# Patient Record
Sex: Male | Born: 1970 | Race: White | Hispanic: No | Marital: Single | Smoking: Current every day smoker
Health system: Southern US, Community
[De-identification: ages and names within clinical notes are randomized; demographics above are authoritative.]

## PROBLEM LIST (undated history)

## (undated) HISTORY — PX: APPENDECTOMY: SHX54

---

## 2013-03-22 ENCOUNTER — Emergency Department (HOSPITAL_COMMUNITY)
Admission: EM | Admit: 2013-03-22 | Discharge: 2013-03-22 | Disposition: A | Discharge status: Home / Self Care | Payer: No Typology Code available for payment source | Attending: Emergency Medicine | Admitting: Emergency Medicine

## 2013-03-22 ENCOUNTER — Emergency Department (HOSPITAL_COMMUNITY): Payer: No Typology Code available for payment source

## 2013-03-22 ENCOUNTER — Encounter (HOSPITAL_COMMUNITY): Payer: Self-pay | Admitting: Nurse Practitioner

## 2013-03-22 DIAGNOSIS — S46909A Unspecified injury of unspecified muscle, fascia and tendon at shoulder and upper arm level, unspecified arm, initial encounter: Secondary | ICD-10-CM | POA: Insufficient documentation

## 2013-03-22 DIAGNOSIS — Y9389 Activity, other specified: Secondary | ICD-10-CM | POA: Insufficient documentation

## 2013-03-22 DIAGNOSIS — S92919A Unspecified fracture of unspecified toe(s), initial encounter for closed fracture: Secondary | ICD-10-CM | POA: Insufficient documentation

## 2013-03-22 DIAGNOSIS — Z23 Encounter for immunization: Secondary | ICD-10-CM | POA: Insufficient documentation

## 2013-03-22 DIAGNOSIS — T148XXA Other injury of unspecified body region, initial encounter: Secondary | ICD-10-CM

## 2013-03-22 DIAGNOSIS — S92401A Displaced unspecified fracture of right great toe, initial encounter for closed fracture: Secondary | ICD-10-CM

## 2013-03-22 DIAGNOSIS — Y92410 Unspecified street and highway as the place of occurrence of the external cause: Secondary | ICD-10-CM | POA: Insufficient documentation

## 2013-03-22 DIAGNOSIS — S4980XA Other specified injuries of shoulder and upper arm, unspecified arm, initial encounter: Secondary | ICD-10-CM | POA: Insufficient documentation

## 2013-03-22 MED ORDER — CEPHALEXIN 500 MG PO CAPS: 500.0000 mg | ORAL_CAPSULE | Freq: Four times a day (QID) | ORAL | Status: DC

## 2013-03-22 MED ORDER — OXYCODONE-ACETAMINOPHEN 5-325 MG PO TABS
2.0000 | ORAL_TABLET | Freq: Once | ORAL | Status: AC
  Administered 2013-03-22: 2 via ORAL
  Filled 2013-03-22: qty 2

## 2013-03-22 MED ORDER — TETANUS-DIPHTH-ACELL PERTUSSIS 5-2.5-18.5 LF-MCG/0.5 IM SUSP
0.5000 mL | Freq: Once | INTRAMUSCULAR | Status: AC
  Administered 2013-03-22: 0.5 mL via INTRAMUSCULAR
  Filled 2013-03-22: qty 0.5

## 2013-03-22 MED ORDER — OXYCODONE-ACETAMINOPHEN 5-325 MG PO TABS: 1.0000 | ORAL_TABLET | Freq: Three times a day (TID) | ORAL | Status: DC | PRN

## 2013-03-22 NOTE — Progress Notes (Signed)
Orthopedic Tech Progress Note Patient Details:  Timothy Pierce September 23, 1970 [id number]  Ortho Devices Type of Ortho Device: CAM walker Ortho Device/Splint Interventions: Ordered;Application   Jennye Moccasin 03/22/2013, 9:03 PM

## 2013-03-22 NOTE — ED Notes (Signed)
Pt has abrasion to right arm and left elbow. Wrapped with gauze by ems. C/o right foot pain.
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
## 2013-03-25 NOTE — ED Provider Notes (Signed)
Medical screening examination/treatment/procedure(s) were performed by non-physician practitioner and as supervising physician I was immediately available for consultation/collaboration.   David H Yao, MD 03/25/13 2122 

## 2013-04-10 ENCOUNTER — Ambulatory Visit: Payer: Self-pay | Admitting: Internal Medicine

## 2013-04-10 ENCOUNTER — Encounter: Payer: Self-pay | Admitting: Internal Medicine

## 2013-04-10 ENCOUNTER — Ambulatory Visit: Payer: Self-pay | Attending: Internal Medicine | Admitting: Internal Medicine

## 2013-04-10 DIAGNOSIS — IMO0002 Reserved for concepts with insufficient information to code with codable children: Secondary | ICD-10-CM | POA: Insufficient documentation

## 2013-04-10 DIAGNOSIS — Z09 Encounter for follow-up examination after completed treatment for conditions other than malignant neoplasm: Secondary | ICD-10-CM | POA: Insufficient documentation
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
## 2016-06-14 ENCOUNTER — Emergency Department (HOSPITAL_COMMUNITY): Payer: Self-pay

## 2016-06-14 ENCOUNTER — Encounter (HOSPITAL_COMMUNITY): Payer: Self-pay

## 2016-06-14 ENCOUNTER — Emergency Department (HOSPITAL_COMMUNITY)
Admission: EM | Admit: 2016-06-14 | Discharge: 2016-06-14 | Disposition: A | Discharge status: Home / Self Care | Payer: Self-pay | Attending: Emergency Medicine | Admitting: Emergency Medicine

## 2016-06-14 DIAGNOSIS — F17210 Nicotine dependence, cigarettes, uncomplicated: Secondary | ICD-10-CM | POA: Insufficient documentation

## 2016-06-14 DIAGNOSIS — M109 Gout, unspecified: Secondary | ICD-10-CM | POA: Insufficient documentation

## 2016-06-14 MED ORDER — DEXAMETHASONE SODIUM PHOSPHATE 10 MG/ML IJ SOLN
10.0000 mg | Freq: Once | INTRAMUSCULAR | Status: AC
  Administered 2016-06-14: 10 mg via INTRAMUSCULAR
  Filled 2016-06-14: qty 1

## 2016-06-14 MED ORDER — OXYCODONE-ACETAMINOPHEN 5-325 MG PO TABS
1.0000 | ORAL_TABLET | Freq: Once | ORAL | Status: AC
  Administered 2016-06-14: 1 via ORAL
  Filled 2016-06-14: qty 1

## 2016-06-14 MED ORDER — INDOMETHACIN 50 MG PO CAPS: 50.0000 mg | ORAL_CAPSULE | Freq: Two times a day (BID) | ORAL | 0 refills | Status: DC

## 2016-06-14 MED ORDER — OXYCODONE-ACETAMINOPHEN 5-325 MG PO TABS: 1.0000 | ORAL_TABLET | ORAL | 0 refills | Status: DC | PRN

## 2016-06-14 NOTE — ED Notes (Signed)
Papers and medications reviewed with patient and he verbalizes understanding. 

## 2016-06-14 NOTE — ED Triage Notes (Signed)
Pt. sts that he woke up with pain in his foot yesterday. He works on his feet all day and sts he has never has pain this bad in his feet

## 2016-06-14 NOTE — Discharge Instructions (Signed)
Take your medications as prescribed as needed for pain relief. I recommend resting, elevating and applying ice to her foot for 15-20 minutes 3-4 times daily to additionally help with swelling and pain. Please follow up with a primary care provider from the Resource Guide provided below in one week if your symptoms have not improved. Please return to the Emergency Department if symptoms worsen or new onset of fever, worsening redness/swelling, numbness, tingling, weakness, decreased range of motion.
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
## 2016-08-31 ENCOUNTER — Encounter: Payer: Self-pay | Admitting: Sports Medicine

## 2016-08-31 ENCOUNTER — Ambulatory Visit (INDEPENDENT_AMBULATORY_CARE_PROVIDER_SITE_OTHER): Payer: Commercial Managed Care - PPO | Admitting: Sports Medicine

## 2016-08-31 ENCOUNTER — Ambulatory Visit (INDEPENDENT_AMBULATORY_CARE_PROVIDER_SITE_OTHER): Payer: Commercial Managed Care - PPO

## 2016-08-31 DIAGNOSIS — S92501A Displaced unspecified fracture of right lesser toe(s), initial encounter for closed fracture: Secondary | ICD-10-CM | POA: Diagnosis not present

## 2016-08-31 DIAGNOSIS — M1000 Idiopathic gout, unspecified site: Secondary | ICD-10-CM | POA: Diagnosis not present

## 2016-08-31 DIAGNOSIS — M79671 Pain in right foot: Secondary | ICD-10-CM | POA: Diagnosis not present

## 2016-08-31 DIAGNOSIS — M109 Gout, unspecified: Secondary | ICD-10-CM

## 2016-08-31 MED ORDER — METHYLPREDNISOLONE 4 MG PO TBPK: ORAL_TABLET | ORAL | 0 refills | Status: DC

## 2016-08-31 MED ORDER — COLCHICINE 0.6 MG PO TABS: 0.6000 mg | ORAL_TABLET | Freq: Every day | ORAL | 0 refills | Status: AC
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
## 2016-08-31 NOTE — Patient Instructions (Signed)

## 2016-09-21 ENCOUNTER — Ambulatory Visit: Payer: Commercial Managed Care - PPO | Admitting: Sports Medicine

## 2016-09-22 ENCOUNTER — Encounter: Payer: Self-pay | Admitting: Podiatry

## 2016-09-22 ENCOUNTER — Ambulatory Visit (INDEPENDENT_AMBULATORY_CARE_PROVIDER_SITE_OTHER): Payer: Commercial Managed Care - PPO

## 2016-09-22 ENCOUNTER — Ambulatory Visit (INDEPENDENT_AMBULATORY_CARE_PROVIDER_SITE_OTHER): Payer: Commercial Managed Care - PPO | Admitting: Podiatry

## 2016-09-22 DIAGNOSIS — M7751 Other enthesopathy of right foot: Secondary | ICD-10-CM

## 2016-09-22 DIAGNOSIS — S92501D Displaced unspecified fracture of right lesser toe(s), subsequent encounter for fracture with routine healing: Secondary | ICD-10-CM

## 2016-09-22 DIAGNOSIS — M109 Gout, unspecified: Secondary | ICD-10-CM | POA: Diagnosis not present

## 2016-09-22 MED ORDER — BETAMETHASONE SOD PHOS & ACET 6 (3-3) MG/ML IJ SUSP: 3.0000 mg | Freq: Once | INTRAMUSCULAR | Status: AC

## 2016-09-22 NOTE — Progress Notes (Signed)
   Subjective:  46 year old male presents to the office today for follow-up evaluation of a closed fracture to the fifth digit right foot as well as acute gout to the right foot. Patient states that he feels much better radiographic work. Patient was last seen on 08/31/2016 in the care of Dr. Marylene LandStover. On last visit patient was prescribed a Medrol Dosepak as well as colchicine 0.6 mg. Also postoperative shoe was dispensed.    Objective/Physical Exam General: The patient is alert and oriented x3 in no acute distress.  Dermatology: Skin is warm, dry and supple bilateral lower extremities. Negative for open lesions or macerations.  Vascular: Palpable pedal pulses bilaterally. No edema or erythema noted. Capillary refill within normal limits.  Neurological: Epicritic and protective threshold grossly intact bilaterally.   Musculoskeletal Exam: Moderate pain on palpation and range of motion to the first MPJ right foot. Minimal amount of edema localized around the joint.  Radiographic Exam:  Closed, nondisplaced fracture of the head of the proximal phalanx fifth digit right foot with routine healing.  Assessment: #1 fracture fifth digit right foot #2 acute gout first MPJ right foot with capsulitis   Plan of Care:  #1 Patient was evaluated. #2 injection of 0.5 mL Celestone Soluspan injected in the first MPJ right foot #3 medical note to return to work was provided #4 return to clinic as needed   Felecia ShellingBrent M. Evans, DPM [facility]  Dr. Felecia ShellingBrent M. Evans, DPM    [address]                                        [place], [place] [address]                Office [phone]  Fax [phone]

## 2016-10-05 ENCOUNTER — Ambulatory Visit: Payer: Commercial Managed Care - PPO | Admitting: Sports Medicine

## 2016-10-18 ENCOUNTER — Other Ambulatory Visit: Payer: Self-pay | Admitting: Podiatry

## 2016-10-18 ENCOUNTER — Ambulatory Visit (INDEPENDENT_AMBULATORY_CARE_PROVIDER_SITE_OTHER): Payer: Commercial Managed Care - PPO | Admitting: Podiatry

## 2016-10-18 ENCOUNTER — Encounter: Payer: Self-pay | Admitting: Podiatry

## 2016-10-18 DIAGNOSIS — M7751 Other enthesopathy of right foot: Secondary | ICD-10-CM | POA: Diagnosis not present

## 2016-10-18 DIAGNOSIS — M109 Gout, unspecified: Secondary | ICD-10-CM | POA: Diagnosis not present

## 2016-10-18 MED ORDER — ALLOPURINOL 100 MG PO TABS: 100.0000 mg | ORAL_TABLET | Freq: Every day | ORAL | 6 refills | Status: AC

## 2016-10-19 LAB — URIC ACID: URIC ACID: 7.1 mg/dL (ref 3.7–8.6)

## 2016-10-22 MED ORDER — BETAMETHASONE SOD PHOS & ACET 6 (3-3) MG/ML IJ SUSP: 3.0000 mg | Freq: Once | INTRAMUSCULAR | Status: AC

## 2016-10-22 NOTE — Progress Notes (Signed)
   Subjective:  Patient presents today for follow-up evaluation of chronic recurrence of acute gout attacks. Patient was last seen 06/22/2017 for evaluation of an acute gout attack to the first MPJ right foot. Patient states that he continues to have pain and tenderness to the first MPJ right foot.    Objective/Physical Exam General: The patient is alert and oriented x3 in no acute distress.  Dermatology: Skin is warm, dry and supple bilateral lower extremities. Negative for open lesions or macerations.  Vascular: Palpable pedal pulses bilaterally. No edema or erythema noted. Capillary refill within normal limits.  Neurological: Epicritic and protective threshold grossly intact bilaterally.   Musculoskeletal Exam: Moderate pain on palpation and range of motion to the first MPJ right foot. Minimal amount of edema localized around the joint.  Assessment: #1 fracture fifth digit right foot-resolved #2 acute gout first MPJ right foot with capsulitis   Plan of Care:  #1 Patient was evaluated. #2 injection of 0.5 mL Celestone Soluspan injected in the first MPJ right foot #3 orders for uric acid levels were placed today #4 prescription for allopurinol #5 return to clinic in 4 weeks  Felecia Shelling, DPM [facility]  Dr. Felecia Shelling, DPM    [address]                                        [place], [place] [address]                Office [phone]  Fax [phone]

## 2016-11-15 ENCOUNTER — Ambulatory Visit: Payer: Commercial Managed Care - PPO | Admitting: Podiatry

## 2017-02-21 ENCOUNTER — Emergency Department (HOSPITAL_COMMUNITY)
Admission: EM | Admit: 2017-02-21 | Discharge: 2017-02-21 | Disposition: A | Discharge status: Home / Self Care | Payer: Commercial Managed Care - PPO | Attending: Emergency Medicine | Admitting: Emergency Medicine

## 2017-02-21 ENCOUNTER — Emergency Department (HOSPITAL_COMMUNITY): Payer: Commercial Managed Care - PPO

## 2017-02-21 ENCOUNTER — Encounter (HOSPITAL_COMMUNITY): Payer: Self-pay | Admitting: Emergency Medicine

## 2017-02-21 DIAGNOSIS — F17210 Nicotine dependence, cigarettes, uncomplicated: Secondary | ICD-10-CM | POA: Insufficient documentation

## 2017-02-21 DIAGNOSIS — K047 Periapical abscess without sinus: Secondary | ICD-10-CM | POA: Insufficient documentation

## 2017-02-21 DIAGNOSIS — Z79899 Other long term (current) drug therapy: Secondary | ICD-10-CM | POA: Insufficient documentation

## 2017-02-21 DIAGNOSIS — F1020 Alcohol dependence, uncomplicated: Secondary | ICD-10-CM | POA: Insufficient documentation

## 2017-02-21 DIAGNOSIS — R220 Localized swelling, mass and lump, head: Secondary | ICD-10-CM | POA: Diagnosis present

## 2017-02-21 LAB — CBC WITH DIFFERENTIAL/PLATELET
BASOS ABS: 0.1 10*3/uL (ref 0.0–0.1)
BASOS PCT: 0 %
EOS ABS: 0.1 10*3/uL (ref 0.0–0.7)
Eosinophils Relative: 0 %
HEMATOCRIT: 40.9 % (ref 39.0–52.0)
HEMOGLOBIN: 14.1 g/dL (ref 13.0–17.0)
Lymphocytes Relative: 14 %
Lymphs Abs: 2.1 10*3/uL (ref 0.7–4.0)
MCH: 30 pg (ref 26.0–34.0)
MCHC: 34.5 g/dL (ref 30.0–36.0)
MCV: 87 fL (ref 78.0–100.0)
Monocytes Absolute: 1.5 10*3/uL — ABNORMAL HIGH (ref 0.1–1.0)
Monocytes Relative: 9 %
NEUTROS ABS: 12.1 10*3/uL — AB (ref 1.7–7.7)
NEUTROS PCT: 77 %
Platelets: 251 10*3/uL (ref 150–400)
RBC: 4.7 MIL/uL (ref 4.22–5.81)
RDW: 12.9 % (ref 11.5–15.5)
WBC: 15.8 10*3/uL — AB (ref 4.0–10.5)

## 2017-02-21 LAB — COMPREHENSIVE METABOLIC PANEL
ALK PHOS: 52 U/L (ref 38–126)
ALT: 23 U/L (ref 17–63)
ANION GAP: 10 (ref 5–15)
AST: 26 U/L (ref 15–41)
Albumin: 3.5 g/dL (ref 3.5–5.0)
BILIRUBIN TOTAL: 0.8 mg/dL (ref 0.3–1.2)
BUN: 9 mg/dL (ref 6–20)
CALCIUM: 8.8 mg/dL — AB (ref 8.9–10.3)
CO2: 20 mmol/L — AB (ref 22–32)
Chloride: 105 mmol/L (ref 101–111)
Creatinine, Ser: 1 mg/dL (ref 0.61–1.24)
GFR calc Af Amer: >60 mL/min (ref >60)
GFR calc non Af Amer: >60 mL/min (ref >60)
GLUCOSE: 116 mg/dL — AB (ref 65–99)
POTASSIUM: 3.8 mmol/L (ref 3.5–5.1)
SODIUM: 135 mmol/L (ref 135–145)
TOTAL PROTEIN: 6.2 g/dL — AB (ref 6.5–8.1)

## 2017-02-21 LAB — ETHANOL: Alcohol, Ethyl (B): <5 mg/dL (ref <5)

## 2017-02-21 MED ORDER — HYDROCODONE-ACETAMINOPHEN 5-325 MG PO TABS: 1.0000 | ORAL_TABLET | ORAL | 0 refills | Status: AC | PRN

## 2017-02-21 MED ORDER — MORPHINE SULFATE (PF) 4 MG/ML IV SOLN
4.0000 mg | Freq: Once | INTRAVENOUS | Status: AC
  Administered 2017-02-21: 4 mg via INTRAVENOUS
  Filled 2017-02-21: qty 1

## 2017-02-21 MED ORDER — SODIUM CHLORIDE 0.9 % IV BOLUS (SEPSIS)
1000.0000 mL | Freq: Once | INTRAVENOUS | Status: AC
  Administered 2017-02-21: 1000 mL via INTRAVENOUS

## 2017-02-21 MED ORDER — DEXAMETHASONE SODIUM PHOSPHATE 10 MG/ML IJ SOLN
4.0000 mg | Freq: Once | INTRAMUSCULAR | Status: AC
  Administered 2017-02-21: 4 mg via INTRAVENOUS
  Filled 2017-02-21: qty 1

## 2017-02-21 MED ORDER — CLINDAMYCIN PHOSPHATE 600 MG/50ML IV SOLN
600.0000 mg | Freq: Once | INTRAVENOUS | Status: AC
  Administered 2017-02-21: 600 mg via INTRAVENOUS
  Filled 2017-02-21: qty 50

## 2017-02-21 MED ORDER — AMOXICILLIN 500 MG PO CAPS: 500.0000 mg | ORAL_CAPSULE | Freq: Three times a day (TID) | ORAL | 0 refills | Status: AC

## 2017-02-21 NOTE — Care Management (Signed)
ED Referral Summary with Disclaimer  Gae BonDickerson, Danielle J was seen in the ED and was advised to follow up with you as necessary. It is the sole responsibility of the patient to arrange for an appointment with you or your practice. This notification in no way is meant to establish a doctor-patient relationship

## 2017-02-21 NOTE — ED Notes (Signed)
patient resting at present aware he is waiting on provider.

## 2017-02-21 NOTE — Care Management (Signed)
ED CM consulted concerning medication assistance patient presented to [facility] ED with dental pain and facial swelling. Patient has health insurance [employer] does not have a r a PCP at this time. Patient will be discharged with a antibiotic and follow up with on call Dentist, CM will also provide resources for [facility] PCP. CM met with patient at bedside to discuss recommendations for  transitional care planning. Patient is agreeable with plan. CM provided with Goodrx card to assist with medications cost.  CM unable to schedule PCP appointment clinic is closed for lunch patient does not have a working phone so states he will walk over after discharge today. No further ED CM needs identified.

## 2017-02-21 NOTE — ED Triage Notes (Signed)
Pt presents with left sided facial swelling with ?fever x 2-3 days. Pt and family believe may be related to dental caries.
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
## 2017-02-21 NOTE — Discharge Instructions (Signed)
Take amoxicillin three times daily for a week.   Take motrin for pain.   Take vicodin for severe pain. DO NOT drive with it. Avoid drinking alcohol   Call Dr. Lacretia LeighKnox's office today for appointment this week.   Follow up with primary care doctor   Return to ER if you have fever, worse facial swelling, trouble speaking, trouble swallowing

## 2017-02-21 NOTE — ED Notes (Signed)
Transported to CT 

## 2017-02-24 ENCOUNTER — Ambulatory Visit: Payer: Commercial Managed Care - PPO | Admitting: Family Medicine

## 2017-12-16 ENCOUNTER — Other Ambulatory Visit: Payer: Self-pay | Admitting: Podiatry

## 2018-03-06 IMAGING — CT CT NECK W/O CM
4 of 5 series · 16 of 33 positions shown, 18 images · IV contrast (APPLIED)
Comparison: None.

CLINICAL DATA: Left-sided facial and neck swelling for 3 days.
Tooth abscess. History of contrast allergy.

EXAM:
CT NECK WITHOUT CONTRAST
TECHNIQUE: Multidetector CT imaging of the neck was performed following the
standard protocol without intravenous contrast.

[Series 3: neck 2.0 i31s 3 · axial · 0.53mm/px · z∈[-286,-126]mm · 4 of 134 slices shown]
[im 27/134  bone]
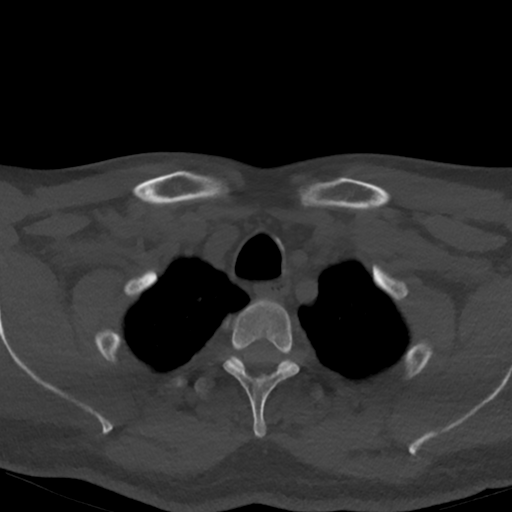
[im 54/134  bone]
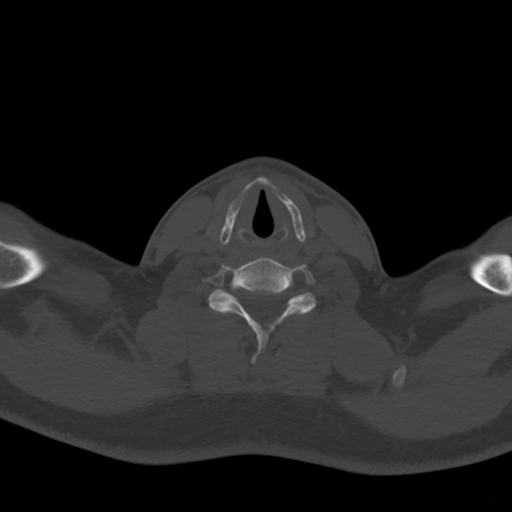
[im 80/134  bone]
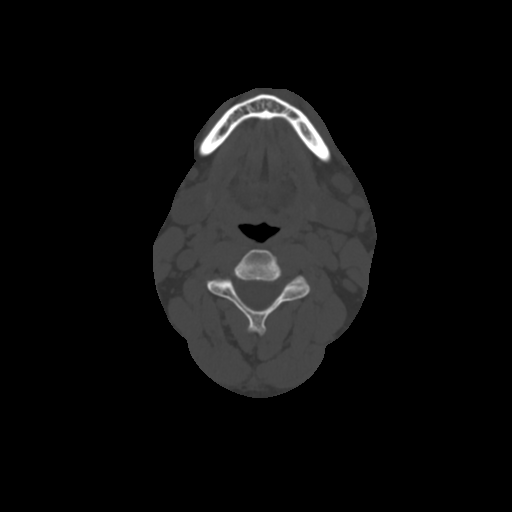
[im 107/134  bone]
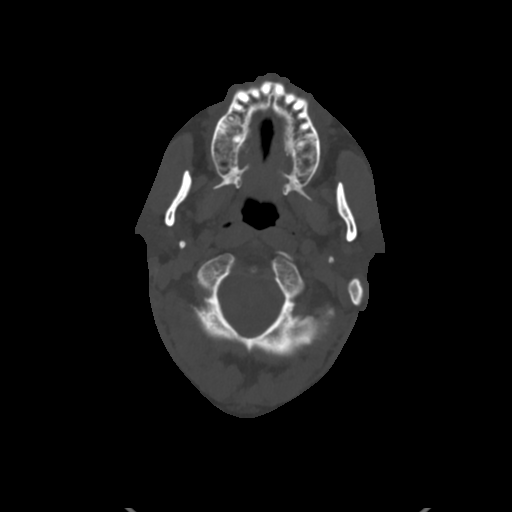

[Series 6: coronal st · coronal · 0.63mm/px · 3 of 105 slices shown]
[im 21/105  bone]
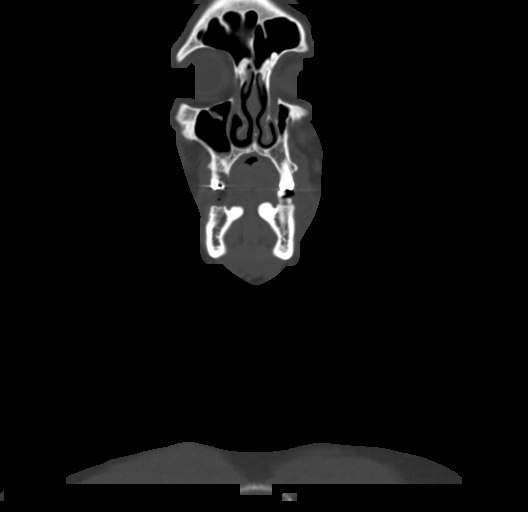
[im 42/105  bone]
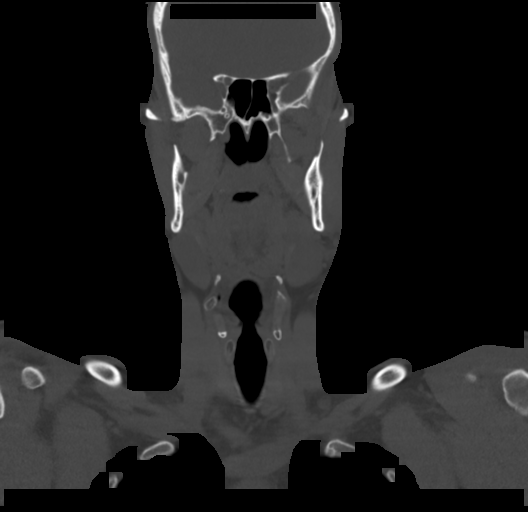
[im 63/105  bone]
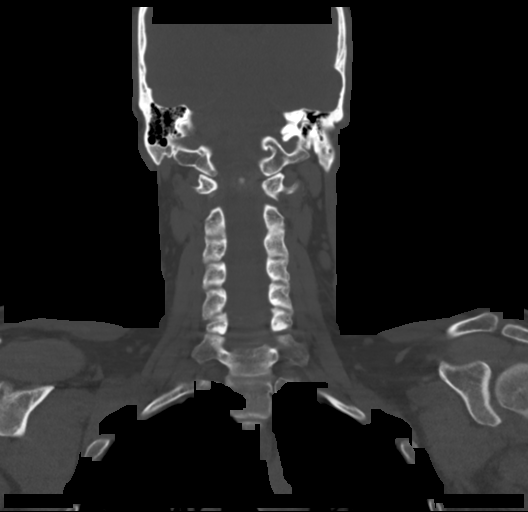

[Series 7: sagittal st · sagittal · 0.59mm/px · 5 of 77 slices shown, 6 images]
[im 26/77  bone]
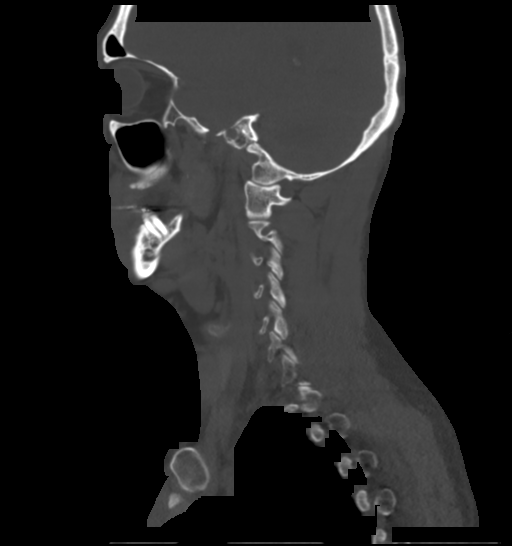
[im 32/77  bone]
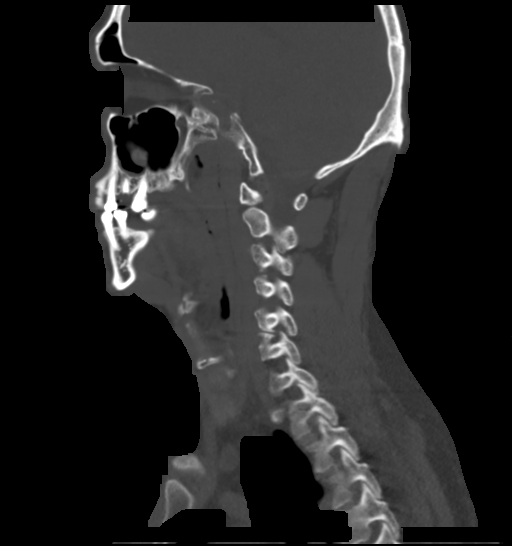
[im 39/77  soft-tissue]
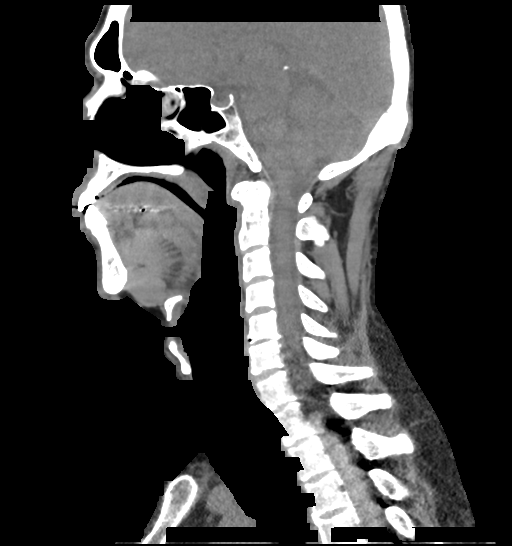
[im 39/77  bone]
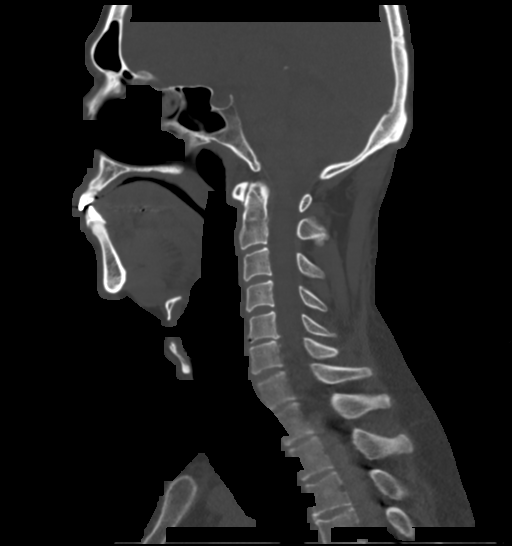
[im 45/77  bone]
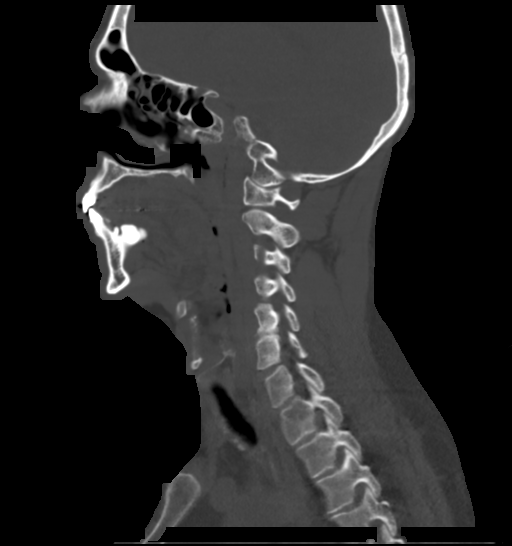
[im 51/77  bone]
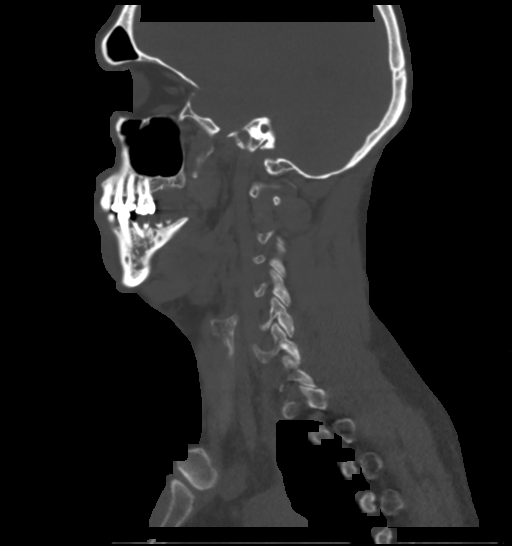

[Series 8: orthogonal st · axial · 0.50mm/px · z∈[-291,-99]mm · 4 of 162 slices shown, 5 images]
[im 33/162  soft-tissue]
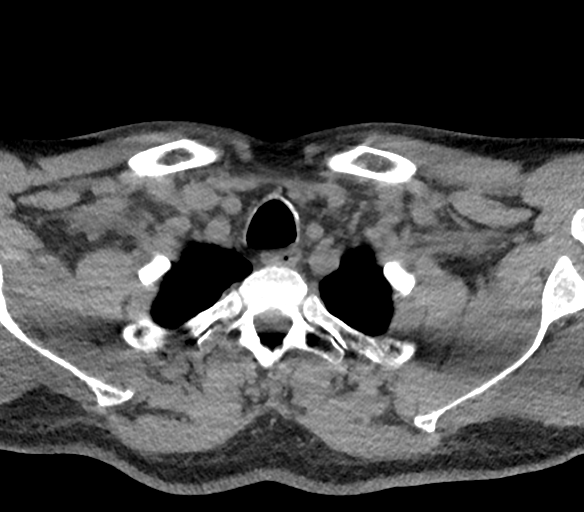
[im 33/162  bone]
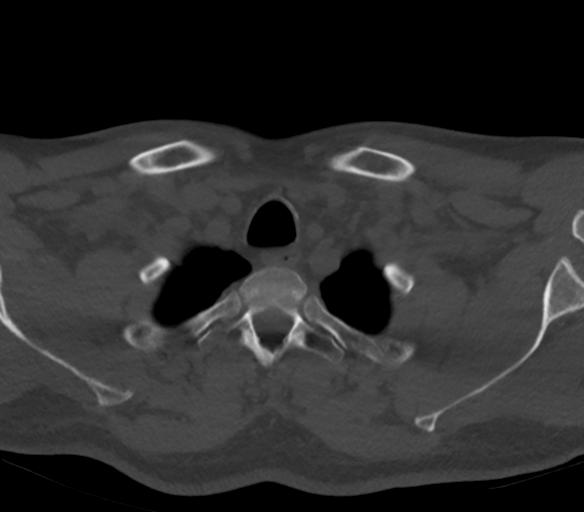
[im 65/162  bone]
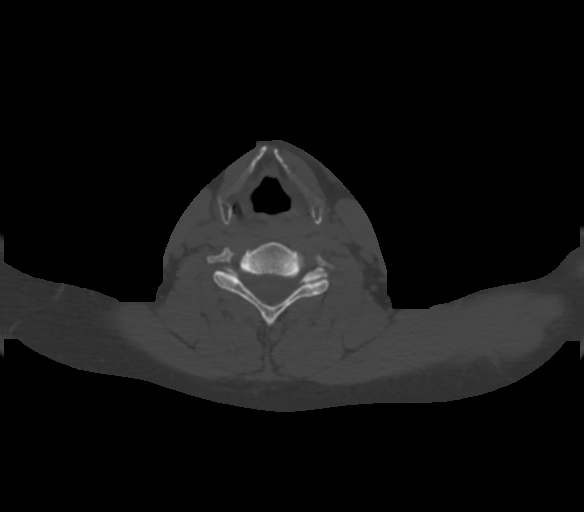
[im 97/162  bone]
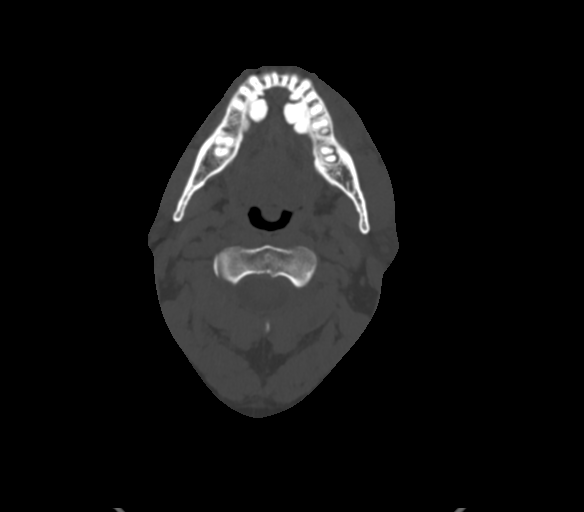
[im 129/162  bone]
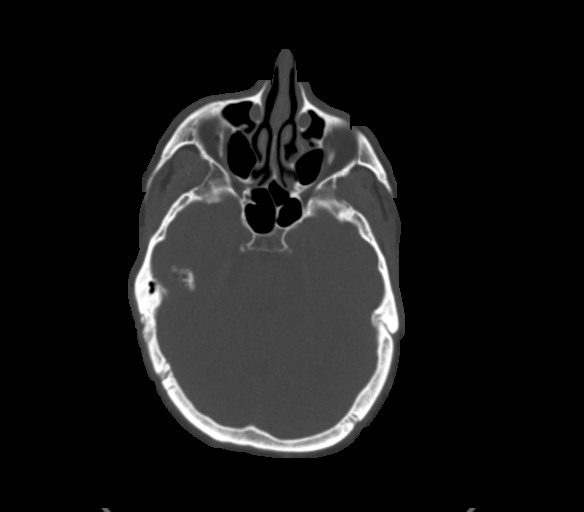

[16 of 33 positions shown; findings below may reference images not displayed]

FINDINGS: There is soft tissue inflammation about the left jaw, with platysma
thickening and subcutaneous reticulation. This correlates with
history of abscessed tooth, both teeth 18 and 19 have large
periapical erosions. No detectable abscess.

Pharynx and larynx: Negative for mass or swelling.

Salivary glands: No inflammation, mass, or stone.

Thyroid: Negative

Lymph nodes: Asymmetric enlargement of left submandibular lymph
nodes, expected.

Vascular: Negative

Limited intracranial: Negative

Visualized orbits: Negative

Mastoids and visualized paranasal sinuses: Mild mucosal thickening
in the ethmoids. No fluid levels.

Skeleton: Dental disease noted above. No acute or aggressive
finding. Incidental torus mandibularis.

Upper chest: No acute finding

Other: Dermal inclusion cysts partly seen in the occipital scalp.
IMPRESSION: 1. Left submandibular inflammation compatible with odontogenic
infection. There are periapical erosions/abscesses around teeth 18
and 19.
2. No airway compromise or floor of mouth inflammation.
3. Noncontrast study due to allergy.  No indication of abscess.

## 2018-06-26 ENCOUNTER — Encounter (HOSPITAL_COMMUNITY): Payer: Self-pay | Admitting: Emergency Medicine

## 2018-06-26 ENCOUNTER — Emergency Department (HOSPITAL_COMMUNITY)
Admission: EM | Admit: 2018-06-26 | Discharge: 2018-06-26 | Disposition: A | Discharge status: Home / Self Care | Payer: Commercial Managed Care - PPO | Attending: Emergency Medicine | Admitting: Emergency Medicine

## 2018-06-26 ENCOUNTER — Other Ambulatory Visit: Payer: Self-pay

## 2018-06-26 DIAGNOSIS — B349 Viral infection, unspecified: Secondary | ICD-10-CM

## 2018-06-26 DIAGNOSIS — Z79899 Other long term (current) drug therapy: Secondary | ICD-10-CM | POA: Insufficient documentation

## 2018-06-26 DIAGNOSIS — F17210 Nicotine dependence, cigarettes, uncomplicated: Secondary | ICD-10-CM | POA: Insufficient documentation

## 2018-06-26 MED ORDER — LOPERAMIDE HCL 2 MG PO CAPS: 2.0000 mg | ORAL_CAPSULE | Freq: Four times a day (QID) | ORAL | 0 refills | Status: AC | PRN

## 2018-06-26 NOTE — ED Triage Notes (Addendum)
Colds s/s x 3 days, chills, cough sob , taking over the counter meds helped some, some diarrhea

## 2018-06-26 NOTE — ED Notes (Signed)
Patient left at this time with all belongings. 

## 2018-06-26 NOTE — Discharge Instructions (Addendum)
Please read attached information. If you experience any new or worsening signs or symptoms please return to the emergency room for evaluation. Please follow-up with your primary care provider or specialist as discussed. Please use medication prescribed only as directed and discontinue taking if you have any concerning signs or symptoms.   °
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
## 2020-07-18 ENCOUNTER — Ambulatory Visit: Admission: EM | Admit: 2020-07-18 | Discharge: 2020-07-18 | Discharge status: Against Medical Advice | Payer: 59

## 2020-07-18 ENCOUNTER — Other Ambulatory Visit: Payer: Self-pay

## 2020-07-18 NOTE — ED Notes (Signed)
Called in waiting room no response. 

## 2020-07-18 NOTE — ED Notes (Signed)
Called waiting room x2 no response
